# Patient Record
Sex: Female | Born: 1980
Health system: Southern US, Community
[De-identification: ages and names within clinical notes are randomized; demographics above are authoritative.]

---

## 2016-11-10 DIAGNOSIS — T2020XA Burn of second degree of head, face, and neck, unspecified site, initial encounter: Secondary | ICD-10-CM | POA: Diagnosis not present

## 2016-11-11 DIAGNOSIS — T2000XA Burn of unspecified degree of head, face, and neck, unspecified site, initial encounter: Secondary | ICD-10-CM | POA: Diagnosis not present

## 2016-11-15 DIAGNOSIS — T2000XA Burn of unspecified degree of head, face, and neck, unspecified site, initial encounter: Secondary | ICD-10-CM | POA: Diagnosis not present

## 2016-11-22 DIAGNOSIS — Z713 Dietary counseling and surveillance: Secondary | ICD-10-CM | POA: Diagnosis not present

## 2016-11-22 DIAGNOSIS — T2000XA Burn of unspecified degree of head, face, and neck, unspecified site, initial encounter: Secondary | ICD-10-CM | POA: Diagnosis not present

## 2016-11-22 DIAGNOSIS — Z299 Encounter for prophylactic measures, unspecified: Secondary | ICD-10-CM | POA: Diagnosis not present

## 2016-11-22 DIAGNOSIS — Z6823 Body mass index (BMI) 23.0-23.9, adult: Secondary | ICD-10-CM | POA: Diagnosis not present

## 2017-01-10 ENCOUNTER — Encounter: Payer: Self-pay | Admitting: *Deleted

## 2017-05-27 DIAGNOSIS — Z01419 Encounter for gynecological examination (general) (routine) without abnormal findings: Secondary | ICD-10-CM | POA: Diagnosis not present

## 2017-05-27 DIAGNOSIS — Z6822 Body mass index (BMI) 22.0-22.9, adult: Secondary | ICD-10-CM | POA: Diagnosis not present

## 2017-05-27 DIAGNOSIS — Z3041 Encounter for surveillance of contraceptive pills: Secondary | ICD-10-CM | POA: Diagnosis not present

## 2017-05-28 MED FILL — LARIN 21 1-20 TABLET: 1-20 | 84 days supply | Qty: 84 | Fill #0

## 2017-10-24 MED FILL — LARIN 21 1-20 TABLET: 1-20 | 84 days supply | Qty: 84 | Fill #1 | Status: TO

## 2018-02-11 MED FILL — NORETHIND-ETH ESTRAD 1-0.02: 1-20 | 84 days supply | Qty: 84 | Fill #0

## 2018-02-11 MED FILL — SHIPPING COST: 1 days supply | Qty: 1 | Fill #0

## 2018-03-02 DIAGNOSIS — Z299 Encounter for prophylactic measures, unspecified: Secondary | ICD-10-CM | POA: Diagnosis not present

## 2018-03-02 DIAGNOSIS — R5383 Other fatigue: Secondary | ICD-10-CM | POA: Diagnosis not present

## 2018-03-02 DIAGNOSIS — R42 Dizziness and giddiness: Secondary | ICD-10-CM | POA: Diagnosis not present

## 2018-03-02 DIAGNOSIS — Z6823 Body mass index (BMI) 23.0-23.9, adult: Secondary | ICD-10-CM | POA: Diagnosis not present

## 2018-03-02 DIAGNOSIS — R509 Fever, unspecified: Secondary | ICD-10-CM | POA: Diagnosis not present

## 2018-03-02 DIAGNOSIS — R002 Palpitations: Secondary | ICD-10-CM | POA: Diagnosis not present

## 2018-03-03 DIAGNOSIS — R002 Palpitations: Secondary | ICD-10-CM | POA: Diagnosis not present

## 2018-03-23 DIAGNOSIS — R002 Palpitations: Secondary | ICD-10-CM | POA: Diagnosis not present

## 2018-03-26 DIAGNOSIS — R002 Palpitations: Secondary | ICD-10-CM | POA: Diagnosis not present

## 2018-03-26 MED FILL — METOPROLOL SUCCINATE ER 25: 25 | 90 days supply | Qty: 45 | Fill #0

## 2018-03-26 MED FILL — SHIPPING COST: 1 days supply | Qty: 1 | Fill #1

## 2018-03-27 DIAGNOSIS — I471 Supraventricular tachycardia: Secondary | ICD-10-CM | POA: Diagnosis not present

## 2018-03-27 DIAGNOSIS — I472 Ventricular tachycardia: Secondary | ICD-10-CM | POA: Diagnosis not present

## 2018-03-27 DIAGNOSIS — Z789 Other specified health status: Secondary | ICD-10-CM | POA: Diagnosis not present

## 2018-03-27 DIAGNOSIS — J309 Allergic rhinitis, unspecified: Secondary | ICD-10-CM | POA: Diagnosis not present

## 2018-03-27 DIAGNOSIS — Z6823 Body mass index (BMI) 23.0-23.9, adult: Secondary | ICD-10-CM | POA: Diagnosis not present

## 2018-03-27 DIAGNOSIS — Z299 Encounter for prophylactic measures, unspecified: Secondary | ICD-10-CM | POA: Diagnosis not present

## 2018-03-27 MED FILL — FLUTICASONE PROP 50 MCG SPR: 50 | 30 days supply | Qty: 16 | Fill #0

## 2018-03-30 ENCOUNTER — Other Ambulatory Visit (HOSPITAL_COMMUNITY): Payer: Self-pay | Admitting: Internal Medicine

## 2018-03-30 DIAGNOSIS — R000 Tachycardia, unspecified: Secondary | ICD-10-CM

## 2018-04-01 ENCOUNTER — Ambulatory Visit (HOSPITAL_COMMUNITY)
Admission: RE | Admit: 2018-04-01 | Discharge: 2018-04-01 | Disposition: A | Discharge status: Home / Self Care | Payer: 59 | Source: Ambulatory Visit | Attending: Internal Medicine | Admitting: Internal Medicine

## 2018-04-01 DIAGNOSIS — I472 Ventricular tachycardia: Secondary | ICD-10-CM | POA: Diagnosis not present

## 2018-04-01 DIAGNOSIS — R000 Tachycardia, unspecified: Secondary | ICD-10-CM

## 2018-04-01 DIAGNOSIS — I471 Supraventricular tachycardia: Secondary | ICD-10-CM | POA: Insufficient documentation

## 2018-04-01 NOTE — Progress Notes (Signed)
*  PRELIMINARY RESULTS* Echocardiogram 2D Echocardiogram has been performed.  Tina Fisher, Tina Fisher 04/01/2018, 9:13 AM

## 2018-05-18 MED FILL — NORETHIND-ETH ESTRAD 1-0.02: 1-20 | 84 days supply | Qty: 84 | Fill #1

## 2018-05-18 MED FILL — SHIPPING COST: 1 days supply | Qty: 1 | Fill #2

## 2018-06-22 MED FILL — FLUTICASONE PROP 50 MCG SPR: 50 | 30 days supply | Qty: 16 | Fill #1

## 2018-06-22 MED FILL — METOPROLOL SUCCINATE ER 25: 25 | 90 days supply | Qty: 45 | Fill #1

## 2018-06-22 MED FILL — SHIPPING COST: 1 days supply | Qty: 1 | Fill #3

## 2018-08-26 DIAGNOSIS — Z01419 Encounter for gynecological examination (general) (routine) without abnormal findings: Secondary | ICD-10-CM | POA: Diagnosis not present

## 2018-08-26 DIAGNOSIS — Z3041 Encounter for surveillance of contraceptive pills: Secondary | ICD-10-CM | POA: Diagnosis not present

## 2018-08-26 DIAGNOSIS — Z6823 Body mass index (BMI) 23.0-23.9, adult: Secondary | ICD-10-CM | POA: Diagnosis not present

## 2018-08-26 MED FILL — SHIPPING COST: 1 days supply | Qty: 1 | Fill #4

## 2018-08-26 MED FILL — NORETHIN-ESTRAD-FERR 1-0.02: 1-20 | 84 days supply | Qty: 84 | Fill #0

## 2018-09-21 MED FILL — SHIPPING COST: 1 days supply | Qty: 1 | Fill #5

## 2018-09-21 MED FILL — METOPROLOL SUCCINATE ER 25: 25 | 90 days supply | Qty: 45 | Fill #2

## 2018-12-07 MED FILL — BLISOVI FE 1/20 1-20 MG-MCG: 1-20 | 84 days supply | Qty: 84 | Fill #1

## 2018-12-07 MED FILL — SHIPPING COST: 1 days supply | Qty: 1 | Fill #6

## 2018-12-22 MED FILL — SHIPPING COST: 1 days supply | Qty: 1 | Fill #7

## 2018-12-22 MED FILL — METOPROLOL SUCCINATE ER 25: 25 | 90 days supply | Qty: 45 | Fill #0

## 2019-03-01 MED FILL — NORETHIN-ESTRAD-FERR 1-0.02: 1-20 | 84 days supply | Qty: 84 | Fill #2

## 2019-03-03 MED FILL — METOPROLOL SUCCINATE ER 25: 25 | 90 days supply | Qty: 45 | Fill #1

## 2019-05-24 MED FILL — BLISOVI FE 1/20 1-20 MG-MCG: 1-20 | 84 days supply | Qty: 84 | Fill #3

## 2019-07-01 MED FILL — METOPROLOL SUCCINATE ER 25: 25 | 45 days supply | Qty: 45 | Fill #0

## 2019-09-02 DIAGNOSIS — Z3041 Encounter for surveillance of contraceptive pills: Secondary | ICD-10-CM | POA: Diagnosis not present

## 2019-09-02 DIAGNOSIS — N6321 Unspecified lump in the left breast, upper outer quadrant: Secondary | ICD-10-CM | POA: Diagnosis not present

## 2019-09-02 DIAGNOSIS — Z6823 Body mass index (BMI) 23.0-23.9, adult: Secondary | ICD-10-CM | POA: Diagnosis not present

## 2019-09-02 DIAGNOSIS — Z01419 Encounter for gynecological examination (general) (routine) without abnormal findings: Secondary | ICD-10-CM | POA: Diagnosis not present

## 2019-09-02 MED FILL — BLISOVI FE 1/20 1-20 MG-MCG: 1-20 | 84 days supply | Qty: 84 | Fill #0

## 2019-09-06 ENCOUNTER — Other Ambulatory Visit (HOSPITAL_COMMUNITY): Payer: Self-pay | Admitting: Obstetrics and Gynecology

## 2019-09-06 DIAGNOSIS — IMO0002 Reserved for concepts with insufficient information to code with codable children: Secondary | ICD-10-CM

## 2019-09-07 ENCOUNTER — Other Ambulatory Visit (HOSPITAL_COMMUNITY): Payer: Self-pay | Admitting: Obstetrics and Gynecology

## 2019-09-07 DIAGNOSIS — N63 Unspecified lump in unspecified breast: Secondary | ICD-10-CM

## 2019-09-08 ENCOUNTER — Other Ambulatory Visit: Payer: Self-pay | Admitting: Obstetrics and Gynecology

## 2019-09-08 DIAGNOSIS — N63 Unspecified lump in unspecified breast: Secondary | ICD-10-CM

## 2019-09-14 ENCOUNTER — Other Ambulatory Visit: Payer: Self-pay

## 2019-09-14 ENCOUNTER — Ambulatory Visit
Admission: RE | Admit: 2019-09-14 | Discharge: 2019-09-14 | Disposition: A | Discharge status: Home / Self Care | Payer: Self-pay | Source: Ambulatory Visit | Attending: Obstetrics and Gynecology | Admitting: Obstetrics and Gynecology

## 2019-09-14 DIAGNOSIS — N63 Unspecified lump in unspecified breast: Secondary | ICD-10-CM | POA: Diagnosis not present

## 2019-09-21 ENCOUNTER — Encounter (HOSPITAL_COMMUNITY): Payer: Self-pay

## 2019-09-28 ENCOUNTER — Encounter (HOSPITAL_COMMUNITY): Payer: Self-pay

## 2019-09-28 ENCOUNTER — Other Ambulatory Visit (HOSPITAL_COMMUNITY): Payer: Self-pay

## 2019-09-29 MED FILL — METOPROLOL SUCCINATE ER 25: 25 | 90 days supply | Qty: 45 | Fill #1

## 2019-11-08 MED FILL — BLISOVI FE 1/20 1-20 MG-MCG: 1-20 | 84 days supply | Qty: 84 | Fill #1

## 2020-01-04 MED FILL — METOPROLOL SUCCINATE ER 25: 25 | 90 days supply | Qty: 45 | Fill #0

## 2020-01-18 DIAGNOSIS — H52223 Regular astigmatism, bilateral: Secondary | ICD-10-CM | POA: Diagnosis not present

## 2020-01-18 DIAGNOSIS — H5213 Myopia, bilateral: Secondary | ICD-10-CM | POA: Diagnosis not present

## 2020-01-30 MED FILL — BLISOVI FE 1/20 1-20 MG-MCG: 1-20 | 84 days supply | Qty: 84 | Fill #2

## 2020-02-04 DIAGNOSIS — Z79899 Other long term (current) drug therapy: Secondary | ICD-10-CM | POA: Diagnosis not present

## 2020-02-04 DIAGNOSIS — Z1331 Encounter for screening for depression: Secondary | ICD-10-CM | POA: Diagnosis not present

## 2020-02-04 DIAGNOSIS — Z0000 Encounter for general adult medical examination without abnormal findings: Secondary | ICD-10-CM | POA: Diagnosis not present

## 2020-02-04 DIAGNOSIS — F419 Anxiety disorder, unspecified: Secondary | ICD-10-CM | POA: Diagnosis not present

## 2020-02-04 DIAGNOSIS — Z299 Encounter for prophylactic measures, unspecified: Secondary | ICD-10-CM | POA: Diagnosis not present

## 2020-02-04 DIAGNOSIS — I471 Supraventricular tachycardia: Secondary | ICD-10-CM | POA: Diagnosis not present

## 2020-02-04 DIAGNOSIS — Z789 Other specified health status: Secondary | ICD-10-CM | POA: Diagnosis not present

## 2020-02-04 DIAGNOSIS — Z6823 Body mass index (BMI) 23.0-23.9, adult: Secondary | ICD-10-CM | POA: Diagnosis not present

## 2020-02-04 MED FILL — ESCITALOPRAM 5 MG TABLET: 5 | 30 days supply | Qty: 30 | Fill #0

## 2020-02-04 MED FILL — busPIRone HCL 15 MG TABS: 15 | 10 days supply | Qty: 30 | Fill #0

## 2020-03-03 DIAGNOSIS — I472 Ventricular tachycardia: Secondary | ICD-10-CM | POA: Diagnosis not present

## 2020-03-03 DIAGNOSIS — F419 Anxiety disorder, unspecified: Secondary | ICD-10-CM | POA: Diagnosis not present

## 2020-03-03 DIAGNOSIS — Z299 Encounter for prophylactic measures, unspecified: Secondary | ICD-10-CM | POA: Diagnosis not present

## 2020-03-03 MED FILL — ESCITALOPRAM 10 MG TABLET: 10 | 30 days supply | Qty: 30 | Fill #0

## 2020-03-29 MED FILL — ESCITALOPRAM 10 MG TABLET: 10 | 30 days supply | Qty: 30 | Fill #1

## 2020-04-05 MED FILL — METOPROLOL SUCCINATE ER 25: 25 | 90 days supply | Qty: 45 | Fill #1

## 2020-04-21 ENCOUNTER — Other Ambulatory Visit (HOSPITAL_COMMUNITY): Payer: Self-pay | Admitting: Nurse Practitioner

## 2020-04-21 DIAGNOSIS — F419 Anxiety disorder, unspecified: Secondary | ICD-10-CM | POA: Diagnosis not present

## 2020-04-21 DIAGNOSIS — I471 Supraventricular tachycardia: Secondary | ICD-10-CM | POA: Diagnosis not present

## 2020-04-21 DIAGNOSIS — Z299 Encounter for prophylactic measures, unspecified: Secondary | ICD-10-CM | POA: Diagnosis not present

## 2020-05-01 MED FILL — ESCITALOPRAM 10 MG TABLET: 10 | 30 days supply | Qty: 30 | Fill #2

## 2020-05-30 MED FILL — ESCITALOPRAM 10 MG TABLET: 10 | 90 days supply | Qty: 90 | Fill #0

## 2020-06-12 IMAGING — US US BREAST*R* LIMITED INC AXILLA
1 series · 2 of 2 positions shown · non-contrast
Comparison: Previous exam(s).

CLINICAL DATA: Patient presents with a palpable area of concern in
the left breast felt by her doctor.

EXAM:
DIGITAL DIAGNOSTIC BILATERAL MAMMOGRAM WITH TOMO
ULTRASOUND BILATERAL BREAST

[Series 1: us breast*right* limited inc axilla · 0.07mm/px · 2 of 2 slices shown]
[im 1/2]
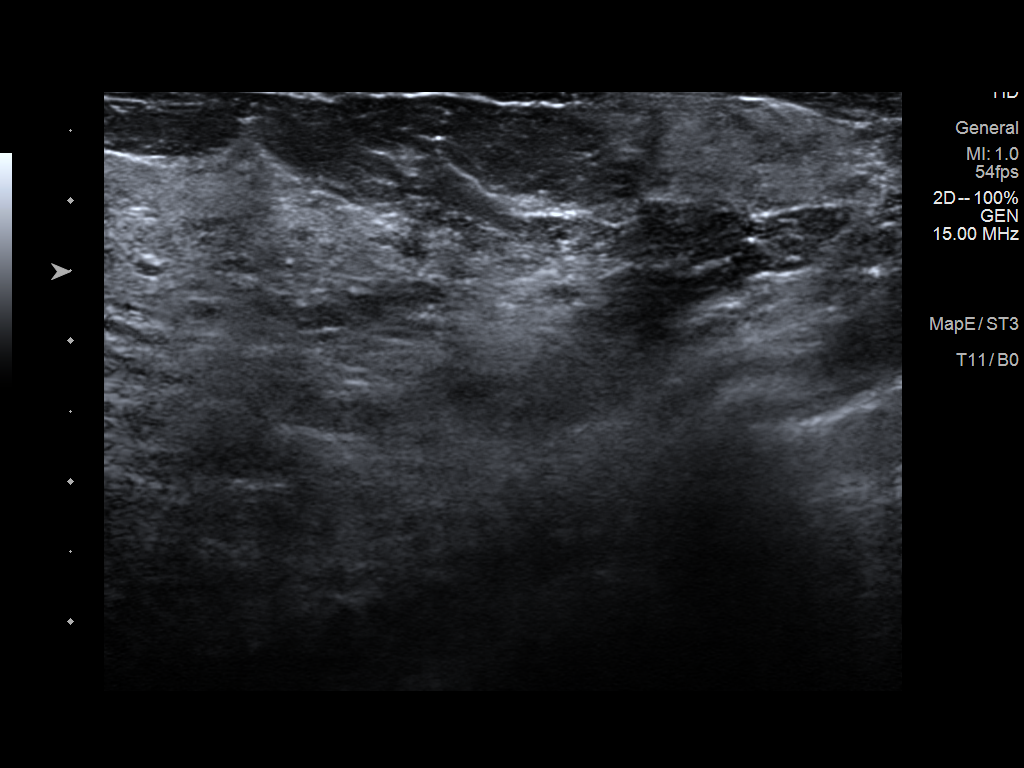
[im 2/2]
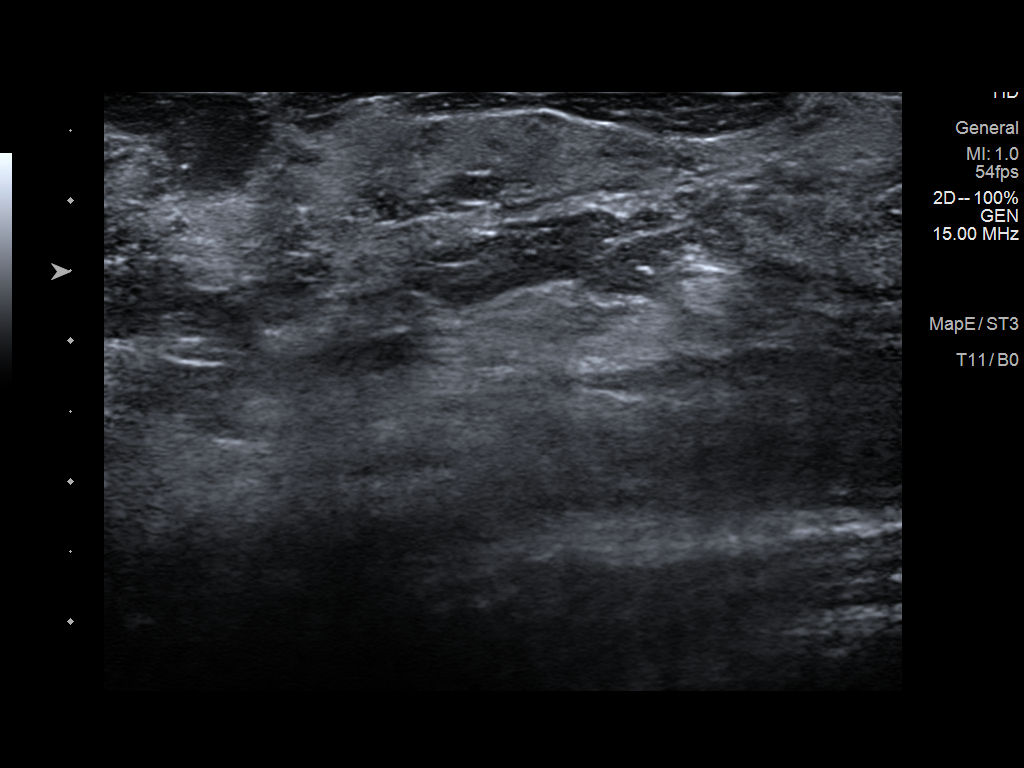

[2 of 2 positions shown; findings below may reference images not displayed]

ACR Breast Density Category d: The breast tissue is extremely dense,
which lowers the sensitivity of mammography.
FINDINGS: Mammogram:

Left: Spot compression views were performed at the palpable area of
concern in the upper outer left breast. No suspicious mass,
distortion, or microcalcifications are identified to suggest
presence of malignancy in the left breast.

Right: There is in asymmetry in the lower central right breast. No
other suspicious finding identified in the bilateral breasts. No
other findings are identified in the right breast.

On physical exam, I palpate a smooth ridge of tissue in the upper
outer left breast at the palpable site of concern.

Targeted ultrasound is performed in the left breast at 1 o'clock 7
cm from the nipple at the palpable site of concern demonstrating a
dense band of tissue. No discrete cystic or solid abnormality.

Targeted ultrasound is performed of the right breast at 6 o'clock 3
cm from the nipple demonstrating normal fibroglandular tissue. No
discrete cystic or solid abnormality.
IMPRESSION: No mammographic or sonographic evidence of malignancy in the
bilateral breasts. There is no suspicious finding at the palpable
site of concern in the upper outer left breast.

RECOMMENDATION:
Screening mammogram in one year.(Code:J8-1-5LA)

Recommend any further workup of the left breast palpable abnormality
be on a clinical basis.

I have discussed the findings and recommendations with the patient.
If applicable, a reminder letter will be sent to the patient
regarding the next appointment.

BI-RADS CATEGORY  1: Negative.

## 2020-06-12 IMAGING — US US BREAST*L* LIMITED INC AXILLA
1 series · 4 of 4 positions shown · non-contrast
Comparison: Previous exam(s).

CLINICAL DATA: Patient presents with a palpable area of concern in
the left breast felt by her doctor.

EXAM:
DIGITAL DIAGNOSTIC BILATERAL MAMMOGRAM WITH TOMO
ULTRASOUND BILATERAL BREAST

[Series 1: us breast*left* limited inc axilla · 0.07mm/px · 4 of 4 slices shown]
[im 1/4]
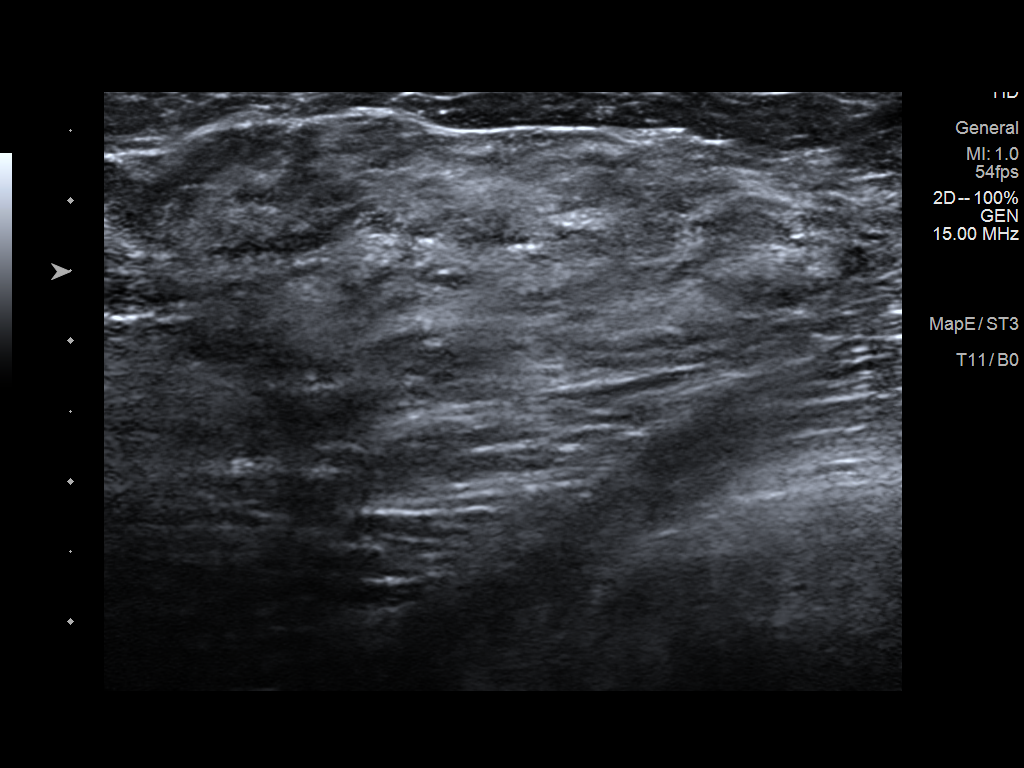
[im 2/4]
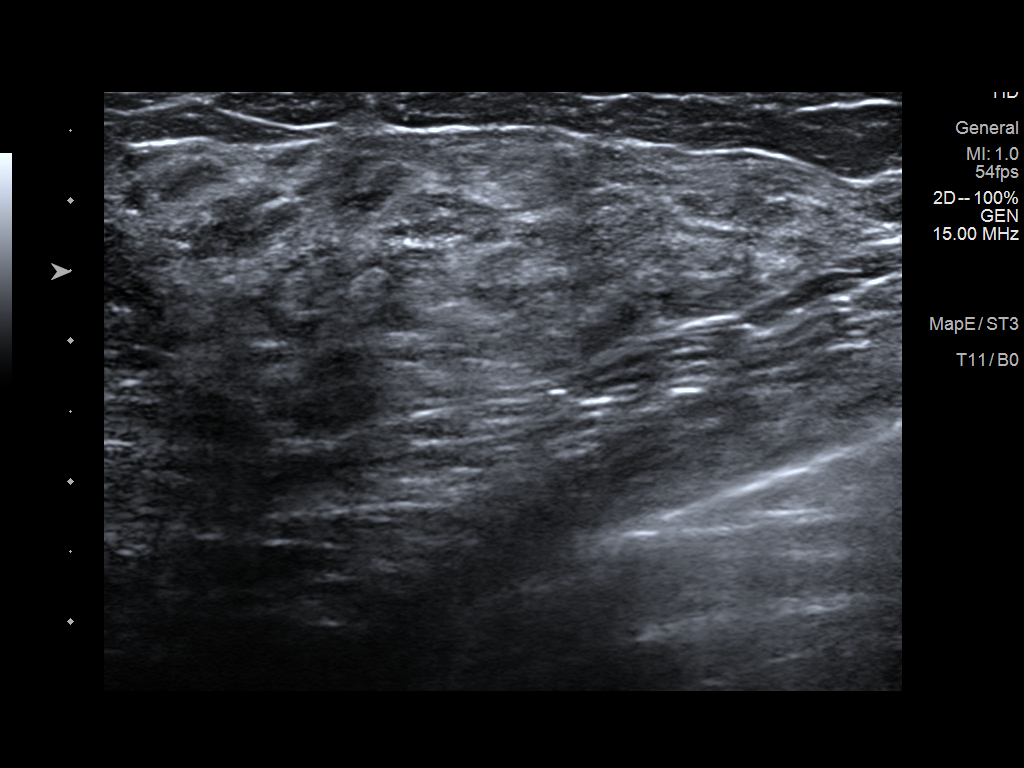
[im 3/4]
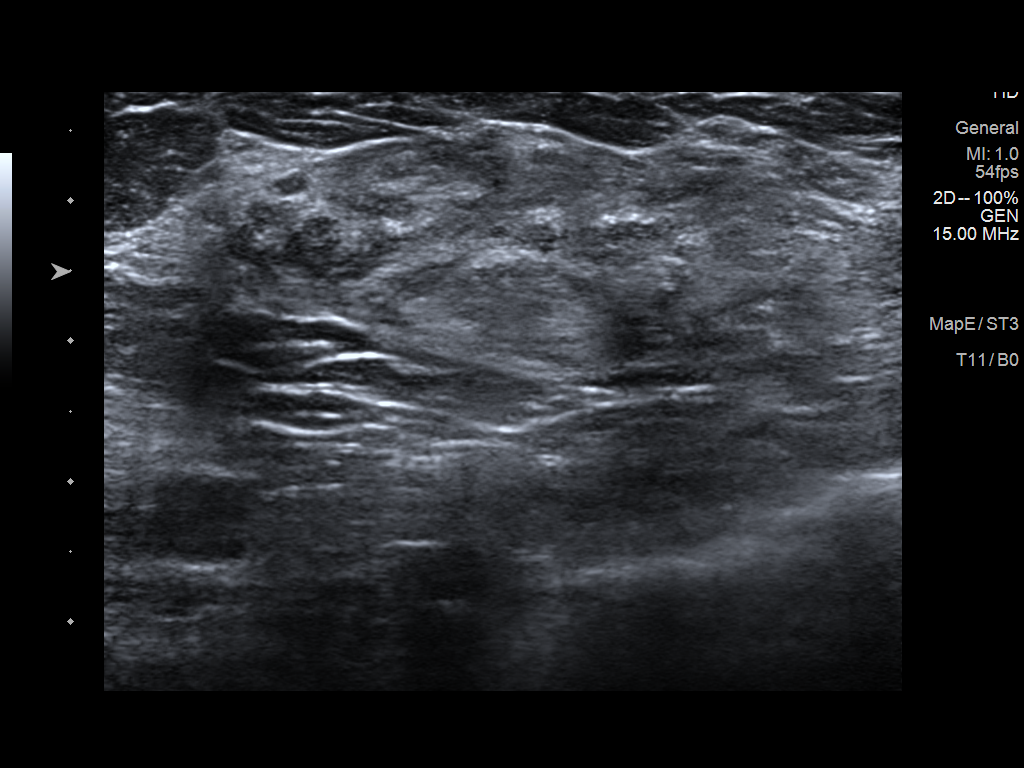
[im 4/4]
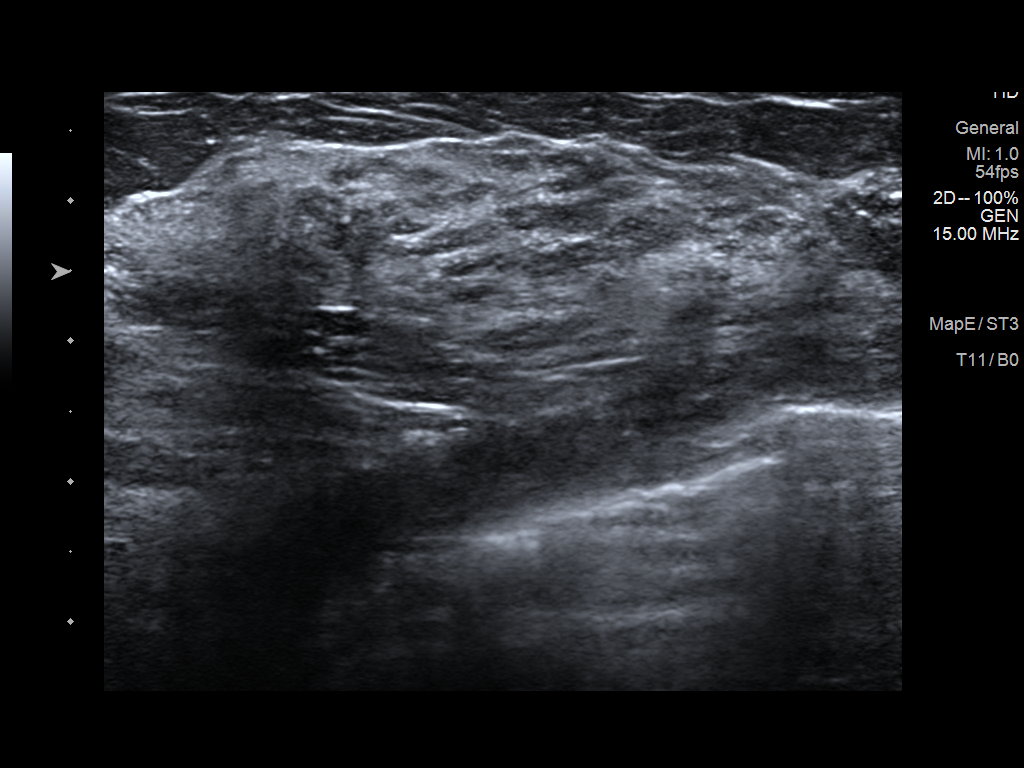

[4 of 4 positions shown; findings below may reference images not displayed]

ACR Breast Density Category d: The breast tissue is extremely dense,
which lowers the sensitivity of mammography.
FINDINGS: Mammogram:

Left: Spot compression views were performed at the palpable area of
concern in the upper outer left breast. No suspicious mass,
distortion, or microcalcifications are identified to suggest
presence of malignancy in the left breast.

Right: There is in asymmetry in the lower central right breast. No
other suspicious finding identified in the bilateral breasts. No
other findings are identified in the right breast.

On physical exam, I palpate a smooth ridge of tissue in the upper
outer left breast at the palpable site of concern.

Targeted ultrasound is performed in the left breast at 1 o'clock 7
cm from the nipple at the palpable site of concern demonstrating a
dense band of tissue. No discrete cystic or solid abnormality.

Targeted ultrasound is performed of the right breast at 6 o'clock 3
cm from the nipple demonstrating normal fibroglandular tissue. No
discrete cystic or solid abnormality.
IMPRESSION: No mammographic or sonographic evidence of malignancy in the
bilateral breasts. There is no suspicious finding at the palpable
site of concern in the upper outer left breast.

RECOMMENDATION:
Screening mammogram in one year.(Code:J8-1-5LA)

Recommend any further workup of the left breast palpable abnormality
be on a clinical basis.

I have discussed the findings and recommendations with the patient.
If applicable, a reminder letter will be sent to the patient
regarding the next appointment.

BI-RADS CATEGORY  1: Negative.

## 2020-07-03 DIAGNOSIS — Z299 Encounter for prophylactic measures, unspecified: Secondary | ICD-10-CM | POA: Diagnosis not present

## 2020-07-03 DIAGNOSIS — W57XXXA Bitten or stung by nonvenomous insect and other nonvenomous arthropods, initial encounter: Secondary | ICD-10-CM | POA: Diagnosis not present

## 2020-07-03 DIAGNOSIS — R21 Rash and other nonspecific skin eruption: Secondary | ICD-10-CM | POA: Diagnosis not present

## 2020-07-07 MED FILL — METOPROLOL SUCCINATE ER 25: 25 | 90 days supply | Qty: 45 | Fill #0

## 2020-07-18 ENCOUNTER — Other Ambulatory Visit (HOSPITAL_COMMUNITY): Payer: Self-pay | Admitting: Obstetrics and Gynecology

## 2020-07-18 MED FILL — BLISOVI FE 1/20 1-20 MG-MCG: 1-20 | 84 days supply | Qty: 84 | Fill #0

## 2020-08-29 MED FILL — ESCITALOPRAM 10 MG TABLET: 10 | 90 days supply | Qty: 90 | Fill #1

## 2020-09-06 DIAGNOSIS — Z01419 Encounter for gynecological examination (general) (routine) without abnormal findings: Secondary | ICD-10-CM | POA: Diagnosis not present

## 2020-09-06 DIAGNOSIS — Z1151 Encounter for screening for human papillomavirus (HPV): Secondary | ICD-10-CM | POA: Diagnosis not present

## 2020-09-06 DIAGNOSIS — Z3041 Encounter for surveillance of contraceptive pills: Secondary | ICD-10-CM | POA: Diagnosis not present

## 2020-09-06 DIAGNOSIS — Z6824 Body mass index (BMI) 24.0-24.9, adult: Secondary | ICD-10-CM | POA: Diagnosis not present

## 2020-10-06 MED FILL — METOPROLOL SUCCINATE ER 25: 25 | 90 days supply | Qty: 45 | Fill #1

## 2020-10-27 DIAGNOSIS — I472 Ventricular tachycardia: Secondary | ICD-10-CM | POA: Diagnosis not present

## 2020-10-27 DIAGNOSIS — F419 Anxiety disorder, unspecified: Secondary | ICD-10-CM | POA: Diagnosis not present

## 2020-10-27 DIAGNOSIS — Z299 Encounter for prophylactic measures, unspecified: Secondary | ICD-10-CM | POA: Diagnosis not present

## 2020-12-04 MED FILL — ESCITALOPRAM 10 MG TABLET: 10 | 90 days supply | Qty: 90 | Fill #2

## 2020-12-28 ENCOUNTER — Other Ambulatory Visit: Payer: Self-pay | Admitting: Obstetrics and Gynecology

## 2020-12-28 DIAGNOSIS — Z1231 Encounter for screening mammogram for malignant neoplasm of breast: Secondary | ICD-10-CM

## 2021-01-01 MED FILL — BLISOVI FE 1/20 1-20 MG-MCG: 1-20 | 84 days supply | Qty: 84 | Fill #1

## 2021-01-02 ENCOUNTER — Other Ambulatory Visit (HOSPITAL_COMMUNITY): Payer: Self-pay | Admitting: Internal Medicine

## 2021-01-02 MED FILL — METOPROLOL SUCCINATE ER 25: 25 | 90 days supply | Qty: 45 | Fill #0

## 2021-01-09 DIAGNOSIS — Z299 Encounter for prophylactic measures, unspecified: Secondary | ICD-10-CM | POA: Diagnosis not present

## 2021-01-09 DIAGNOSIS — Z6824 Body mass index (BMI) 24.0-24.9, adult: Secondary | ICD-10-CM | POA: Diagnosis not present

## 2021-01-09 DIAGNOSIS — Z789 Other specified health status: Secondary | ICD-10-CM | POA: Diagnosis not present

## 2021-01-09 DIAGNOSIS — J029 Acute pharyngitis, unspecified: Secondary | ICD-10-CM | POA: Diagnosis not present

## 2021-01-09 DIAGNOSIS — Z713 Dietary counseling and surveillance: Secondary | ICD-10-CM | POA: Diagnosis not present

## 2021-01-11 ENCOUNTER — Other Ambulatory Visit (HOSPITAL_COMMUNITY): Payer: Self-pay | Admitting: Internal Medicine

## 2021-01-11 MED FILL — predniSONE 5 MG (21) TBPK: 5 | 5 days supply | Qty: 21 | Fill #0

## 2021-02-20 ENCOUNTER — Other Ambulatory Visit: Payer: Self-pay | Admitting: Obstetrics and Gynecology

## 2021-02-20 DIAGNOSIS — Z1231 Encounter for screening mammogram for malignant neoplasm of breast: Secondary | ICD-10-CM

## 2021-03-02 ENCOUNTER — Other Ambulatory Visit (HOSPITAL_COMMUNITY): Payer: Self-pay | Admitting: Student

## 2021-03-02 DIAGNOSIS — Z299 Encounter for prophylactic measures, unspecified: Secondary | ICD-10-CM | POA: Diagnosis not present

## 2021-03-02 DIAGNOSIS — Z79899 Other long term (current) drug therapy: Secondary | ICD-10-CM | POA: Diagnosis not present

## 2021-03-02 DIAGNOSIS — I471 Supraventricular tachycardia: Secondary | ICD-10-CM | POA: Diagnosis not present

## 2021-03-02 DIAGNOSIS — I472 Ventricular tachycardia: Secondary | ICD-10-CM | POA: Diagnosis not present

## 2021-03-02 DIAGNOSIS — Z1331 Encounter for screening for depression: Secondary | ICD-10-CM | POA: Diagnosis not present

## 2021-03-02 DIAGNOSIS — Z0000 Encounter for general adult medical examination without abnormal findings: Secondary | ICD-10-CM | POA: Diagnosis not present

## 2021-03-02 DIAGNOSIS — F419 Anxiety disorder, unspecified: Secondary | ICD-10-CM | POA: Diagnosis not present

## 2021-03-02 DIAGNOSIS — Z789 Other specified health status: Secondary | ICD-10-CM | POA: Diagnosis not present

## 2021-03-02 DIAGNOSIS — Z6824 Body mass index (BMI) 24.0-24.9, adult: Secondary | ICD-10-CM | POA: Diagnosis not present

## 2021-03-05 ENCOUNTER — Other Ambulatory Visit (HOSPITAL_BASED_OUTPATIENT_CLINIC_OR_DEPARTMENT_OTHER): Payer: Self-pay

## 2021-03-05 ENCOUNTER — Other Ambulatory Visit: Payer: Self-pay | Admitting: Obstetrics and Gynecology

## 2021-03-05 DIAGNOSIS — Z1231 Encounter for screening mammogram for malignant neoplasm of breast: Secondary | ICD-10-CM

## 2021-03-06 DIAGNOSIS — F419 Anxiety disorder, unspecified: Secondary | ICD-10-CM | POA: Diagnosis not present

## 2021-03-06 DIAGNOSIS — Z79899 Other long term (current) drug therapy: Secondary | ICD-10-CM | POA: Diagnosis not present

## 2021-03-27 ENCOUNTER — Other Ambulatory Visit (HOSPITAL_COMMUNITY): Payer: Self-pay

## 2021-03-27 MED FILL — Norethindrone Ace & Ethinyl Estradiol-FE Tab 1 MG-20 MCG: ORAL | 84 days supply | Qty: 84 | Fill #0 | Status: AC

## 2021-04-01 MED FILL — Metoprolol Succinate Tab ER 24HR 25 MG (Tartrate Equiv): ORAL | 90 days supply | Qty: 45 | Fill #0 | Status: AC

## 2021-04-02 ENCOUNTER — Other Ambulatory Visit (HOSPITAL_COMMUNITY): Payer: Self-pay

## 2021-04-06 DIAGNOSIS — Z1231 Encounter for screening mammogram for malignant neoplasm of breast: Secondary | ICD-10-CM

## 2021-04-20 ENCOUNTER — Ambulatory Visit
Admission: RE | Admit: 2021-04-20 | Discharge: 2021-04-20 | Disposition: A | Discharge status: Home / Self Care | Payer: 59 | Source: Ambulatory Visit

## 2021-04-20 ENCOUNTER — Other Ambulatory Visit: Payer: Self-pay

## 2021-04-20 DIAGNOSIS — Z1231 Encounter for screening mammogram for malignant neoplasm of breast: Secondary | ICD-10-CM | POA: Diagnosis not present

## 2021-06-07 ENCOUNTER — Other Ambulatory Visit (HOSPITAL_COMMUNITY): Payer: Self-pay

## 2021-06-07 MED FILL — Escitalopram Oxalate Tab 10 MG (Base Equiv): ORAL | 90 days supply | Qty: 90 | Fill #0 | Status: AC

## 2021-06-18 MED FILL — Norethindrone Ace & Ethinyl Estradiol-FE Tab 1 MG-20 MCG: ORAL | 84 days supply | Qty: 84 | Fill #1 | Status: AC

## 2021-06-19 ENCOUNTER — Other Ambulatory Visit (HOSPITAL_COMMUNITY): Payer: Self-pay

## 2021-07-02 ENCOUNTER — Other Ambulatory Visit (HOSPITAL_COMMUNITY): Payer: Self-pay

## 2021-07-03 ENCOUNTER — Other Ambulatory Visit (HOSPITAL_COMMUNITY): Payer: Self-pay

## 2021-07-04 ENCOUNTER — Other Ambulatory Visit (HOSPITAL_COMMUNITY): Payer: Self-pay

## 2021-07-04 MED ORDER — METOPROLOL SUCCINATE ER 25 MG PO TB24
ORAL_TABLET | ORAL | 1 refills | Status: DC
Start: 2021-07-04 — End: 2021-12-26
  Filled 2021-07-04: qty 45, 90d supply, fill #0
  Filled 2021-10-01: qty 45, 90d supply, fill #1

## 2021-09-05 ENCOUNTER — Other Ambulatory Visit (HOSPITAL_COMMUNITY): Payer: Self-pay

## 2021-09-05 MED FILL — Escitalopram Oxalate Tab 10 MG (Base Equiv): ORAL | 90 days supply | Qty: 90 | Fill #1 | Status: AC

## 2021-09-17 ENCOUNTER — Other Ambulatory Visit (HOSPITAL_COMMUNITY): Payer: Self-pay

## 2021-09-17 DIAGNOSIS — Z6824 Body mass index (BMI) 24.0-24.9, adult: Secondary | ICD-10-CM | POA: Diagnosis not present

## 2021-09-17 DIAGNOSIS — Z3041 Encounter for surveillance of contraceptive pills: Secondary | ICD-10-CM | POA: Diagnosis not present

## 2021-09-17 DIAGNOSIS — Z01419 Encounter for gynecological examination (general) (routine) without abnormal findings: Secondary | ICD-10-CM | POA: Diagnosis not present

## 2021-09-17 MED ORDER — NORETHIN ACE-ETH ESTRAD-FE 1-20 MG-MCG PO TABS
1.0000 | ORAL_TABLET | Freq: Every day | ORAL | 3 refills | Status: AC
  Filled 2021-09-17: qty 84, 84d supply, fill #0
  Filled 2021-12-03: qty 84, 84d supply, fill #1
  Filled 2022-02-26: qty 84, 84d supply, fill #2
  Filled 2022-05-19: qty 84, 84d supply, fill #3

## 2021-09-26 DIAGNOSIS — H52223 Regular astigmatism, bilateral: Secondary | ICD-10-CM | POA: Diagnosis not present

## 2021-09-26 DIAGNOSIS — H5213 Myopia, bilateral: Secondary | ICD-10-CM | POA: Diagnosis not present

## 2021-10-01 ENCOUNTER — Other Ambulatory Visit (HOSPITAL_COMMUNITY): Payer: Self-pay

## 2021-12-03 ENCOUNTER — Other Ambulatory Visit (HOSPITAL_COMMUNITY): Payer: Self-pay

## 2021-12-04 ENCOUNTER — Other Ambulatory Visit (HOSPITAL_COMMUNITY): Payer: Self-pay

## 2021-12-05 ENCOUNTER — Other Ambulatory Visit (HOSPITAL_COMMUNITY): Payer: Self-pay

## 2021-12-05 MED ORDER — ESCITALOPRAM OXALATE 10 MG PO TABS
10.0000 mg | ORAL_TABLET | Freq: Every day | ORAL | 2 refills | Status: AC
  Filled 2021-12-05: qty 90, 90d supply, fill #0
  Filled 2022-03-04: qty 90, 90d supply, fill #1
  Filled 2022-06-05: qty 90, 90d supply, fill #2

## 2021-12-25 ENCOUNTER — Other Ambulatory Visit (HOSPITAL_COMMUNITY): Payer: Self-pay

## 2021-12-26 ENCOUNTER — Other Ambulatory Visit (HOSPITAL_COMMUNITY): Payer: Self-pay

## 2021-12-26 MED ORDER — METOPROLOL SUCCINATE ER 25 MG PO TB24
ORAL_TABLET | ORAL | 1 refills | Status: AC
  Filled 2021-12-26: qty 45, 90d supply, fill #0
  Filled 2022-03-30: qty 45, 90d supply, fill #1

## 2022-02-26 ENCOUNTER — Other Ambulatory Visit (HOSPITAL_COMMUNITY): Payer: Self-pay

## 2022-03-04 ENCOUNTER — Other Ambulatory Visit (HOSPITAL_COMMUNITY): Payer: Self-pay

## 2022-03-30 ENCOUNTER — Other Ambulatory Visit (HOSPITAL_COMMUNITY): Payer: Self-pay

## 2022-04-17 ENCOUNTER — Other Ambulatory Visit: Payer: Self-pay | Admitting: Obstetrics and Gynecology

## 2022-04-17 DIAGNOSIS — Z1231 Encounter for screening mammogram for malignant neoplasm of breast: Secondary | ICD-10-CM

## 2022-04-26 ENCOUNTER — Ambulatory Visit
Admission: RE | Admit: 2022-04-26 | Discharge: 2022-04-26 | Disposition: A | Discharge status: Home / Self Care | Payer: 59 | Source: Ambulatory Visit

## 2022-04-26 DIAGNOSIS — Z1231 Encounter for screening mammogram for malignant neoplasm of breast: Secondary | ICD-10-CM | POA: Diagnosis not present

## 2022-05-20 ENCOUNTER — Other Ambulatory Visit (HOSPITAL_COMMUNITY): Payer: Self-pay

## 2022-06-05 ENCOUNTER — Other Ambulatory Visit (HOSPITAL_COMMUNITY): Payer: Self-pay

## 2022-07-01 ENCOUNTER — Other Ambulatory Visit (HOSPITAL_COMMUNITY): Payer: Self-pay

## 2023-01-23 IMAGING — MG MM DIGITAL SCREENING BILAT W/ TOMO AND CAD
8 series · 8 of 24 positions shown · non-contrast
Comparison: Previous exam(s).

CLINICAL DATA: Screening.

EXAM:
DIGITAL SCREENING BILATERAL MAMMOGRAM WITH TOMOSYNTHESIS AND CAD
TECHNIQUE: Bilateral screening digital craniocaudal and mediolateral oblique
mammograms were obtained. Bilateral screening digital breast
tomosynthesis was performed. The images were evaluated with
computer-aided detection.

[L CC synth-2D]
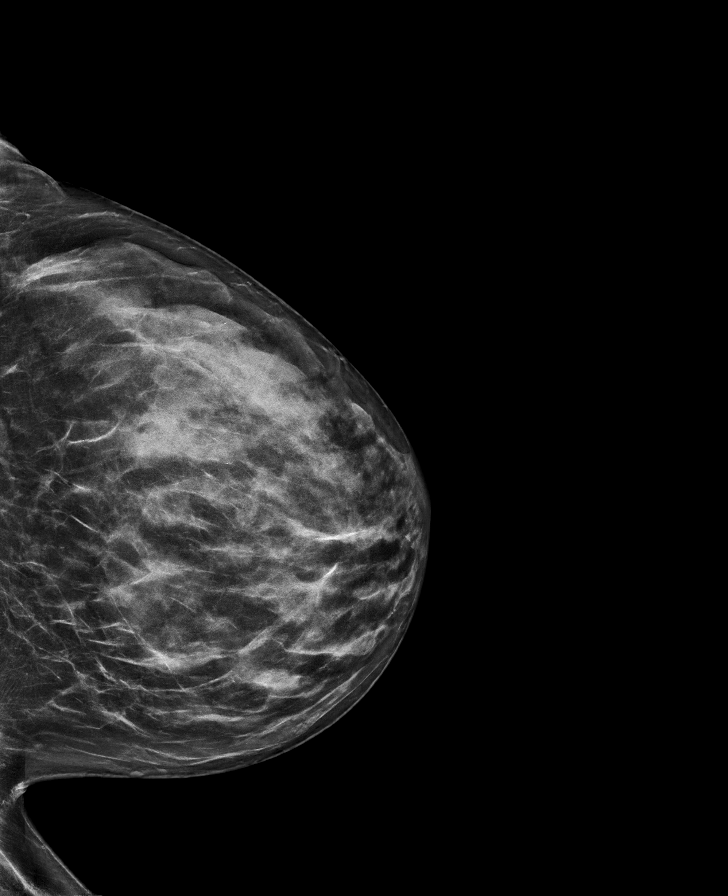

[L MLO synth-2D]
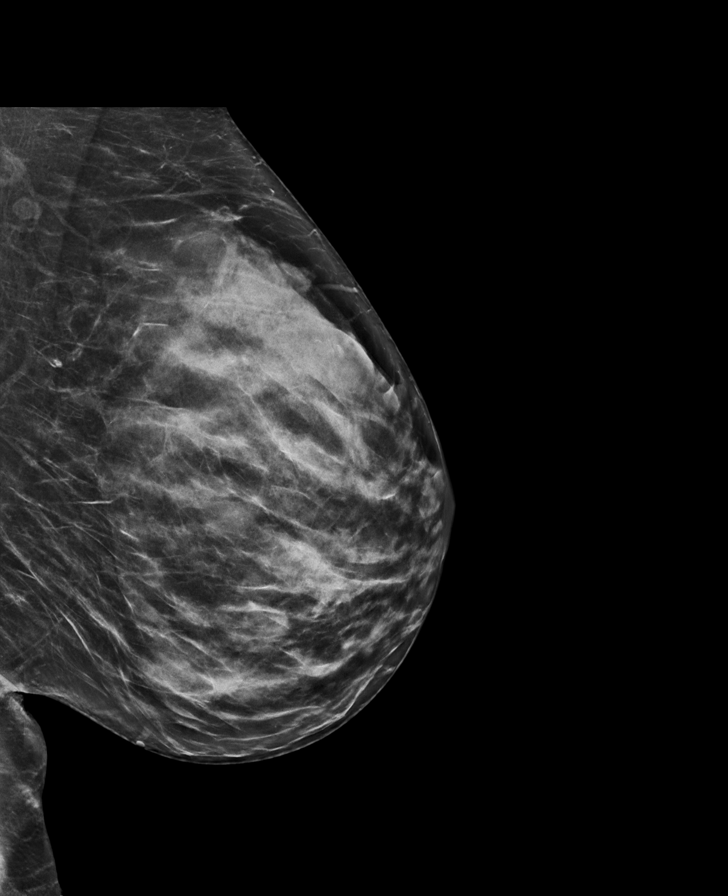

[R MLO synth-2D]
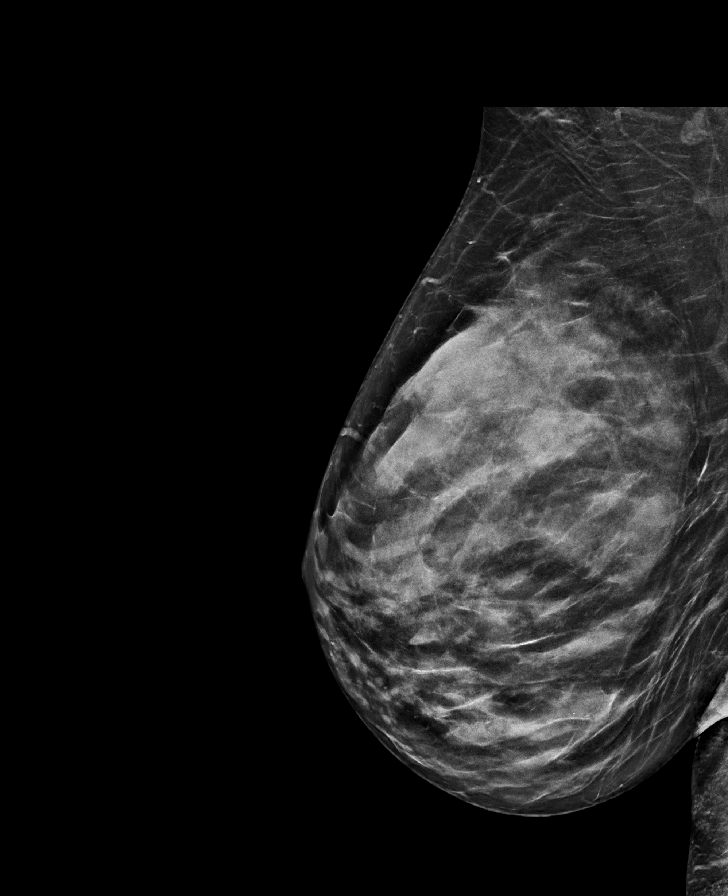

[R CC synth-2D]
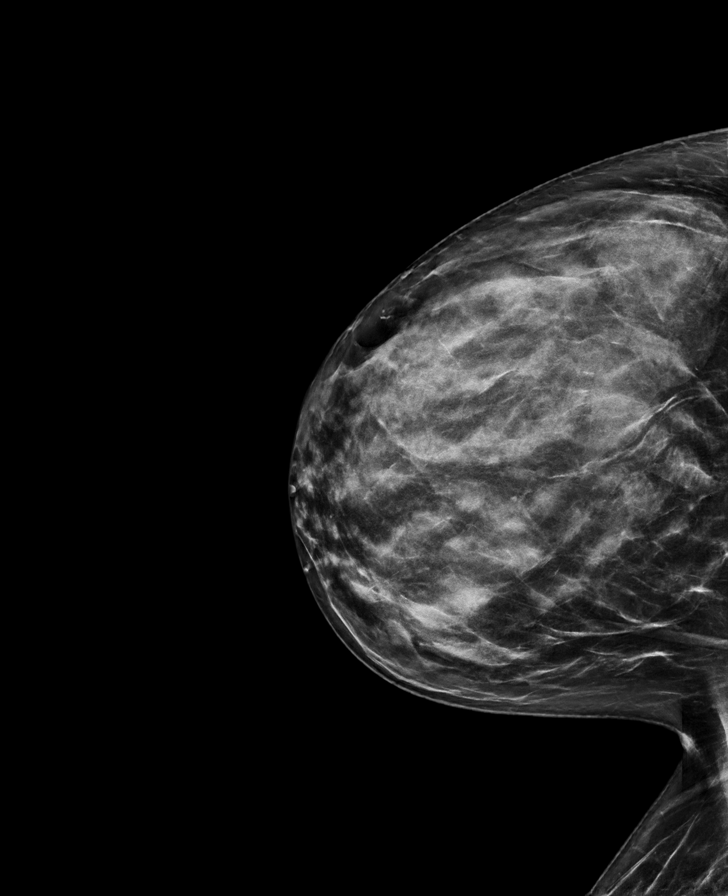

[L CC tomo · tomo slice 39/76.0]
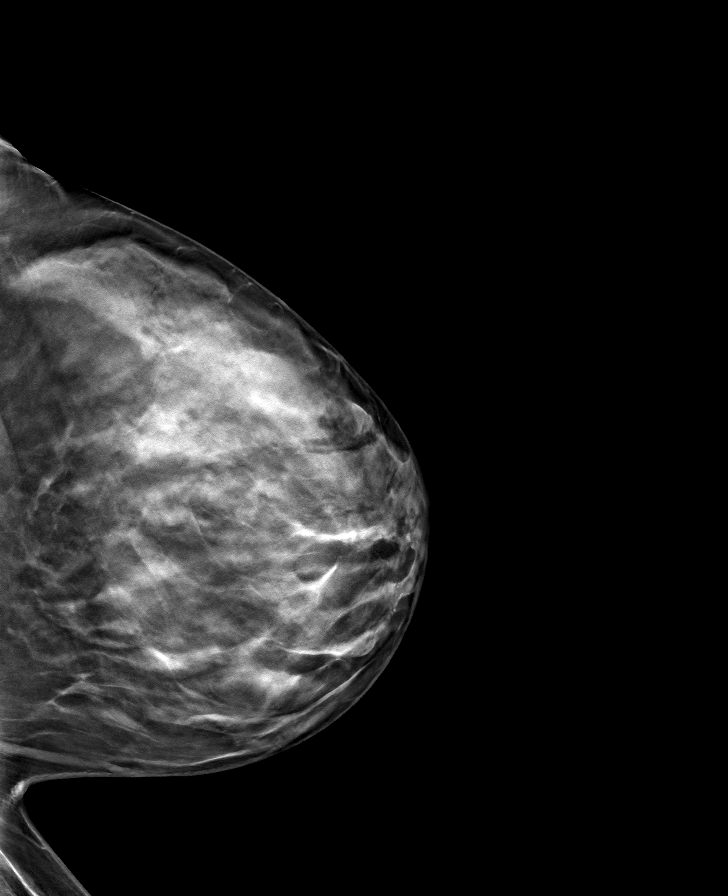

[L MLO tomo · tomo slice 36/71.0]
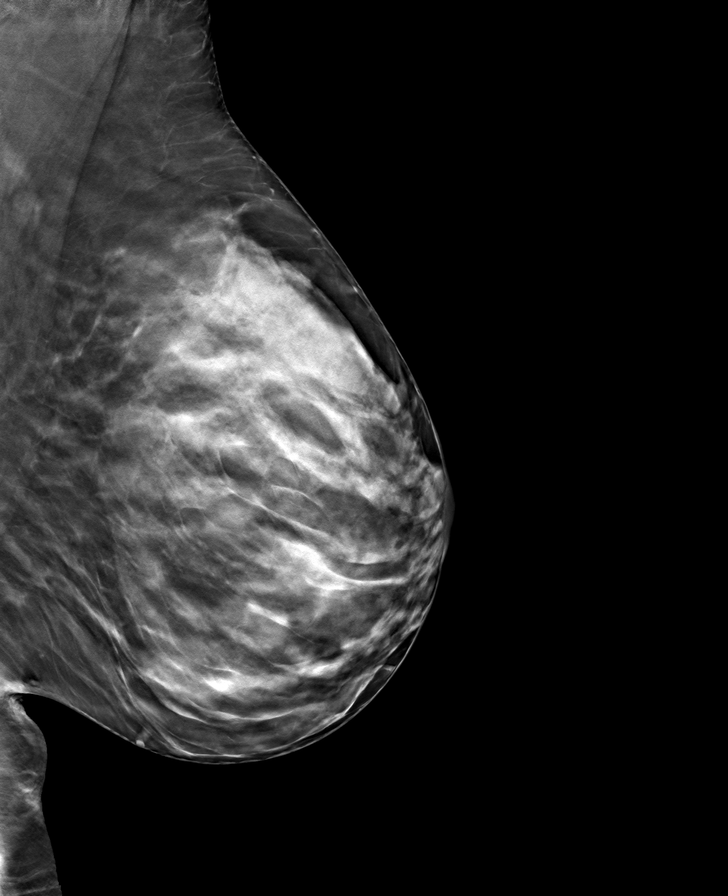

[R MLO tomo · tomo slice 37/72.0]
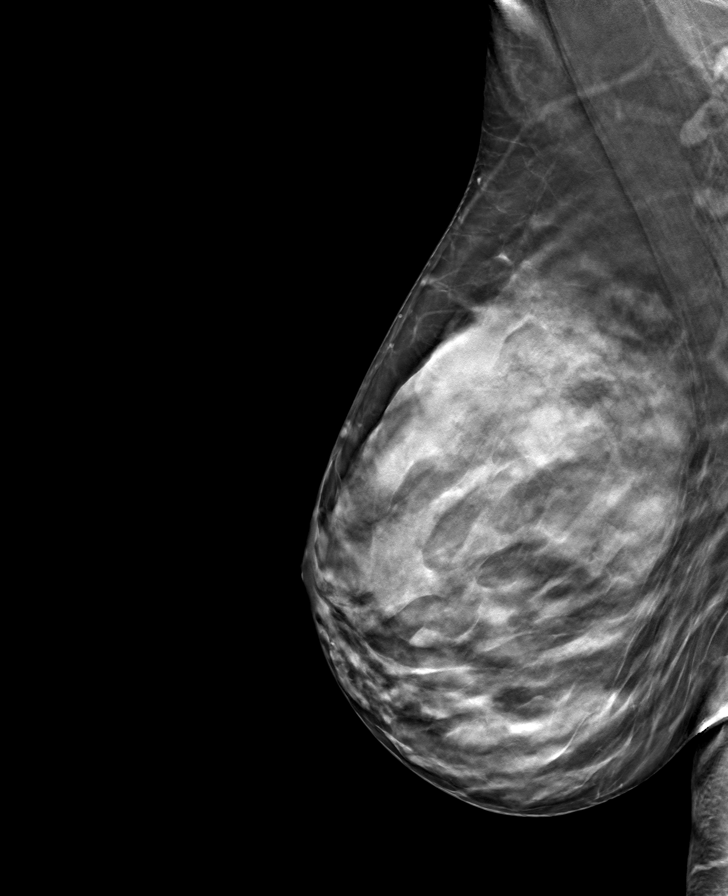

[R CC tomo · tomo slice 37/72.0]
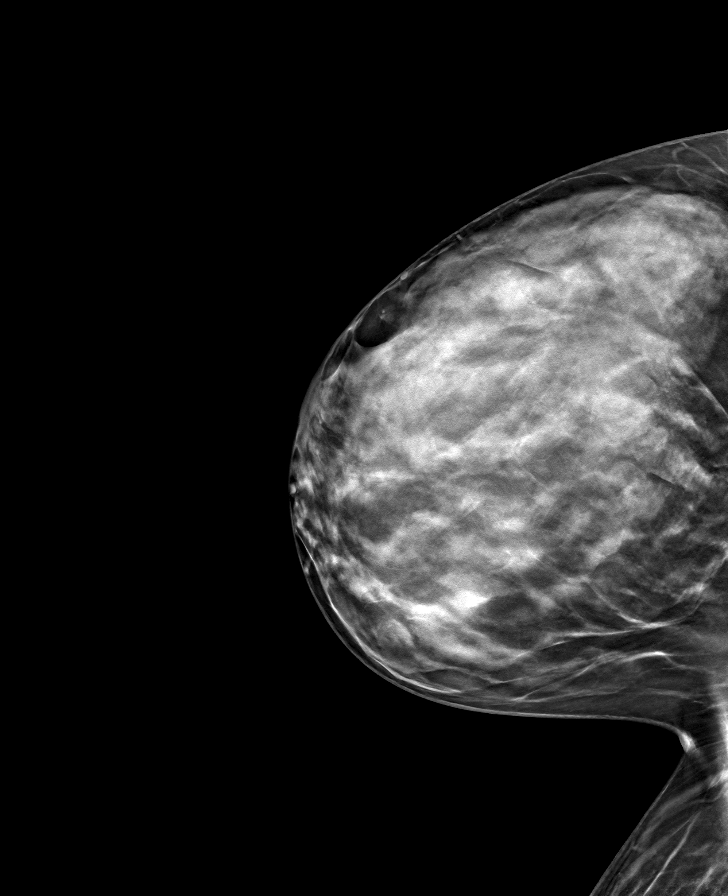

[8 of 24 positions shown; findings below may reference images not displayed]

ACR Breast Density Category c: The breast tissue is heterogeneously
dense, which may obscure small masses.
FINDINGS: There are no findings suspicious for malignancy.
IMPRESSION: No mammographic evidence of malignancy. A result letter of this
screening mammogram will be mailed directly to the patient.

RECOMMENDATION:
Screening mammogram in one year. (Code:[id number])

BI-RADS CATEGORY  1: Negative.

## 2023-05-15 ENCOUNTER — Other Ambulatory Visit: Payer: Self-pay | Admitting: Internal Medicine

## 2023-05-15 DIAGNOSIS — R935 Abnormal findings on diagnostic imaging of other abdominal regions, including retroperitoneum: Secondary | ICD-10-CM

## 2023-05-22 ENCOUNTER — Other Ambulatory Visit: Payer: Self-pay | Admitting: Obstetrics and Gynecology

## 2023-05-22 DIAGNOSIS — Z1231 Encounter for screening mammogram for malignant neoplasm of breast: Secondary | ICD-10-CM

## 2023-06-11 ENCOUNTER — Encounter: Payer: Self-pay | Admitting: Internal Medicine

## 2023-06-11 ENCOUNTER — Ambulatory Visit
Admission: RE | Admit: 2023-06-11 | Discharge: 2023-06-11 | Disposition: A | Discharge status: Home / Self Care | Payer: BC Managed Care – PPO | Source: Ambulatory Visit | Attending: Internal Medicine | Admitting: Internal Medicine

## 2023-06-11 ENCOUNTER — Ambulatory Visit
Admission: RE | Admit: 2023-06-11 | Discharge: 2023-06-11 | Disposition: A | Discharge status: Home / Self Care | Payer: BC Managed Care – PPO | Source: Ambulatory Visit

## 2023-06-11 DIAGNOSIS — R935 Abnormal findings on diagnostic imaging of other abdominal regions, including retroperitoneum: Secondary | ICD-10-CM

## 2023-06-11 DIAGNOSIS — Z1231 Encounter for screening mammogram for malignant neoplasm of breast: Secondary | ICD-10-CM

## 2023-12-29 ENCOUNTER — Other Ambulatory Visit (HOSPITAL_COMMUNITY): Payer: Self-pay | Admitting: Internal Medicine

## 2023-12-29 DIAGNOSIS — R9389 Abnormal findings on diagnostic imaging of other specified body structures: Secondary | ICD-10-CM

## 2023-12-29 DIAGNOSIS — K769 Liver disease, unspecified: Secondary | ICD-10-CM

## 2024-01-06 ENCOUNTER — Ambulatory Visit (HOSPITAL_COMMUNITY): Payer: BC Managed Care – PPO

## 2024-01-06 ENCOUNTER — Encounter (HOSPITAL_COMMUNITY): Payer: Self-pay

## 2024-08-23 ENCOUNTER — Other Ambulatory Visit: Payer: Self-pay | Admitting: Internal Medicine

## 2024-08-23 DIAGNOSIS — Z1231 Encounter for screening mammogram for malignant neoplasm of breast: Secondary | ICD-10-CM

## 2024-09-01 ENCOUNTER — Ambulatory Visit
Admission: RE | Admit: 2024-09-01 | Discharge: 2024-09-01 | Disposition: A | Discharge status: Home / Self Care | Source: Ambulatory Visit

## 2024-09-01 DIAGNOSIS — Z1231 Encounter for screening mammogram for malignant neoplasm of breast: Secondary | ICD-10-CM

## 2024-12-24 ENCOUNTER — Encounter: Payer: Self-pay | Admitting: *Deleted
